# Patient Record
Sex: Male | Born: 1995 | Race: Black or African American | Hispanic: No | Marital: Single | Smoking: Current every day smoker
Health system: Southern US, Community
[De-identification: ages and names within clinical notes are randomized; demographics above are authoritative.]

## PROBLEM LIST (undated history)

## (undated) DIAGNOSIS — F32A Depression, unspecified: Secondary | ICD-10-CM

## (undated) DIAGNOSIS — J45909 Unspecified asthma, uncomplicated: Secondary | ICD-10-CM

## (undated) DIAGNOSIS — F319 Bipolar disorder, unspecified: Secondary | ICD-10-CM

## (undated) DIAGNOSIS — F329 Major depressive disorder, single episode, unspecified: Secondary | ICD-10-CM

---

## 2014-08-17 ENCOUNTER — Encounter (HOSPITAL_COMMUNITY): Payer: Self-pay | Admitting: *Deleted

## 2014-08-17 ENCOUNTER — Emergency Department (HOSPITAL_COMMUNITY)
Admission: EM | Admit: 2014-08-17 | Discharge: 2014-08-17 | Disposition: A | Discharge status: Home / Self Care | Payer: Medicaid Other | Attending: Emergency Medicine | Admitting: Emergency Medicine

## 2014-08-17 DIAGNOSIS — M549 Dorsalgia, unspecified: Secondary | ICD-10-CM | POA: Diagnosis not present

## 2014-08-17 DIAGNOSIS — J069 Acute upper respiratory infection, unspecified: Secondary | ICD-10-CM | POA: Diagnosis not present

## 2014-08-17 DIAGNOSIS — M791 Myalgia: Secondary | ICD-10-CM | POA: Diagnosis present

## 2014-08-17 DIAGNOSIS — R109 Unspecified abdominal pain: Secondary | ICD-10-CM | POA: Diagnosis not present

## 2014-08-17 DIAGNOSIS — Z720 Tobacco use: Secondary | ICD-10-CM | POA: Diagnosis not present

## 2014-08-17 MED ORDER — ARIPIPRAZOLE 15 MG PO TABS: 15.0000 mg | ORAL_TABLET | Freq: Every day | ORAL | Status: DC

## 2014-08-17 MED ORDER — BUPROPION HCL ER (XL) 150 MG PO TB24: 150.0000 mg | ORAL_TABLET | Freq: Every day | ORAL | Status: DC

## 2014-08-17 NOTE — ED Notes (Addendum)
Pt here with girlfriend and her mother- girlfriend's mother states that patient doesn't have anywhere to live at present and has been sleeping in the rain. Pt states that he has no access to OTC medications.

## 2014-08-17 NOTE — ED Notes (Signed)
The pt is c/o total body aches for  2 weeks.  Low back  Pain headache.  No n v  Or diarrhea.  No temp  Coughing up  White mucous.  He has a cold.  He just feels bad

## 2014-08-17 NOTE — ED Notes (Signed)
Pt placed in a gown with BP cuff and pulsed ox

## 2014-08-17 NOTE — ED Notes (Signed)
Pt given Mal[place] sandwich and water- per Dr. Romeo AppleHarrison.

## 2014-08-17 NOTE — ED Provider Notes (Signed)
CSN: [id number]     Arrival date & time 08/17/14  2013 History   First MD Initiated Contact with Patient 08/17/14 2241     Chief Complaint  Patient presents with  . Generalized Body Aches     (Consider location/radiation/quality/duration/timing/severity/associated sxs/prior Treatment) Patient is a 19 y.o. male presenting with headaches. The history is provided by the patient.  Headache Pain location:  Generalized Quality:  Dull Radiates to:  Does not radiate Severity currently:  0/10 Severity at highest:  6/10 Onset quality:  Gradual Duration:  3 days Timing:  Intermittent Progression:  Resolved Chronicity:  New Context comment:  At rest Relieved by:  Nothing Worsened by:  Nothing Ineffective treatments:  None tried Associated symptoms: abdominal pain, back pain and cough   Associated symptoms: no diarrhea, no eye pain, no fever, no nausea, no neck pain, no numbness and no vomiting     History reviewed. No pertinent past medical history. History reviewed. No pertinent past surgical history. No family history on file. History  Substance Use Topics  . Smoking status: Current Every Day Smoker  . Smokeless tobacco: Not on file  . Alcohol Use: No    Review of Systems  Constitutional: Negative for fever.  HENT: Positive for rhinorrhea. Negative for drooling.   Eyes: Negative for pain.  Respiratory: Positive for cough. Negative for shortness of breath.   Cardiovascular: Negative for chest pain and leg swelling.  Gastrointestinal: Positive for abdominal pain. Negative for nausea, vomiting and diarrhea.  Genitourinary: Negative for dysuria and hematuria.  Musculoskeletal: Positive for back pain. Negative for gait problem and neck pain.  Skin: Negative for color change.  Neurological: Positive for headaches. Negative for numbness.  Hematological: Negative for adenopathy.  Psychiatric/Behavioral: Negative for behavioral problems.  All other systems reviewed and are  negative.     Allergies  Review of patient's allergies indicates no known allergies.  Home Medications   Prior to Admission medications   Not on File   BP 111/66 mmHg  Pulse 55  Temp(Src) 97.6 F (36.4 C) (Oral)  Resp 18  Ht 5\' 8"  (1.727 m)  Wt 139 lb (63.05 kg)  BMI 21.14 kg/m2  SpO2 97% Physical Exam  Constitutional: He is oriented to person, place, and time. He appears well-developed and well-nourished.  HENT:  Head: Normocephalic and atraumatic.  Right Ear: External ear normal.  Left Ear: External ear normal.  Nose: Nose normal.  Mouth/Throat: Oropharynx is clear and moist. No oropharyngeal exudate.  Eyes: Conjunctivae and EOM are normal. Pupils are equal, round, and reactive to light.  Neck: Normal range of motion. Neck supple.  Cardiovascular: Normal rate, regular rhythm, normal heart sounds and intact distal pulses.  Exam reveals no gallop and no friction rub.   No murmur heard. Pulmonary/Chest: Effort normal and breath sounds normal. No respiratory distress. He has no wheezes.  Abdominal: Soft. Bowel sounds are normal. He exhibits no distension. There is no tenderness. There is no rebound and no guarding.  Musculoskeletal: Normal range of motion. He exhibits no edema or tenderness.  Neurological: He is alert and oriented to person, place, and time.  alert, oriented x3 speech: normal in context and clarity memory: intact grossly cranial nerves II-XII: intact motor strength: full proximally and distally no involuntary movements or tremors sensation: intact to light touch diffusely  cerebellar: finger-to-nose and heel-to-shin intact gait: normal forwards and backwards  Skin: Skin is warm and dry.  Psychiatric: He has a normal mood and affect. His behavior is normal.  Nursing note and vitals reviewed.   ED Course  Procedures (including critical care time) Labs Review Labs Reviewed - No data to display  Imaging Review No results found.   EKG  Interpretation None      MDM   Final diagnoses:  Viral URI    11:21 PM 20 y.o. male with a history of homelessness who presents with multiple complaints. He states that he developed a mild cough and congestion several days ago. He has also had a mild intermittent headache but denies this currently on exam. He denies any fevers, nausea, vomiting, or diarrhea. He has also had some mild nonspecific low back pain and some abdominal pain which he relates to hunger. He states that he feels depressed but denies any SI. His vital signs are unremarkable here. He likely has a viral syndrome and back pain likely related to sleeping outside. He states he has used up all his time at the shelter currently.  11:22 PM: The pt asked me to refill his meds which I think is reasonable and may help w/ his depression. I have discussed the diagnosis/risks/treatment options with the patient and believe the pt to be eligible for discharge home to follow-up with his psychiatrist as needed. We also discussed returning to the ED immediately if new or worsening sx occur. We discussed the sx which are most concerning (e.g., worsening HA, fever, sob) that necessitate immediate return. Medications administered to the patient during their visit and any new prescriptions provided to the patient are listed below.  Medications given during this visit Medications - No data to display  Discharge Medication List as of 08/17/2014 11:28 PM    START taking these medications   Details  ARIPiprazole (ABILIFY) 15 MG tablet Take 1 tablet (15 mg total) by mouth daily., Starting 08/17/2014, Until Discontinued, Print    buPROPion (WELLBUTRIN XL) 150 MG 24 hr tablet Take 1 tablet (150 mg total) by mouth daily., Starting 08/17/2014, Until Discontinued, Print           Purvis Sheffield, MD 08/18/14 [phone]

## 2014-08-17 NOTE — Discharge Instructions (Signed)
°Emergency Department Resource Guide °1) Find a Doctor and Pay Out of Pocket °Although you won't have to find out who is covered by your insurance plan, it is a good idea to ask around and get recommendations. You will then need to call the office and see if the doctor you have chosen will accept you as a new patient and what types of options they offer for patients who are self-pay. Some doctors offer discounts or will set up payment plans for their patients who do not have insurance, but you will need to ask so you aren't surprised when you get to your appointment. ° °2) Contact Your Local Health Department °Not all health departments have doctors that can see patients for sick visits, but many do, so it is worth a call to see if yours does. If you don't know where your local health department is, you can check in your phone book. The CDC also has a tool to help you locate your state's health department, and many state websites also have listings of all of their local health departments. ° °3) Find a Walk-in Clinic °If your illness is not likely to be very severe or complicated, you may want to try a walk in clinic. These are popping up all over the country in pharmacies, drugstores, and shopping centers. They're usually staffed by nurse practitioners or physician assistants that have been trained to treat common illnesses and complaints. They're usually fairly quick and inexpensive. However, if you have serious medical issues or chronic medical problems, these are probably not your best option. ° °No Primary Care Doctor: °- Call [facility] at  832-8000 - they can help you locate a primary care doctor that  accepts your insurance, provides certain services, etc. °- Physician Referral Service- 1-800-533-3463 ° °Chronic Pain Problems: °Organization         Address  Phone   Notes  °[facility]  (336) 297-2271 Patients need to be referred by their primary care doctor.  ° °Medication  Assistance: °Organization         Address  Phone   Notes  °[place] [facility] [address] °[place], [place] [address] (336) 641-8030 --Must be a resident of [place] °-- Must have NO insurance coverage whatsoever (no Medicaid/ Medicare, etc.) °-- The pt. MUST have a primary care doctor that directs their care regularly and follows them in the community °  °[facility]  (866) 331-1348   °[employer]  (888) 892-1162   ° °Agencies that provide inexpensive medical care: °Organization         Address  Phone   Notes  °[facility]  (336) 832-8035   °[facility]    (336) 832-7272   °[facility] [address] °[place], [place] [address] (336) 832-4777   °[facility] [address], °[place] (336) 271-4999   °[facility]    (336) 373-0678   °[facility]    (336) 272-1050   °[facility] ° [address], [place] Phone:  (336) 832-4444, Fax:  (336) 832-4440 Hours of Operation:  9 am - 6 pm, M-F.  Also accepts Medicaid/Medicare and self-pay.  °[facility] ° [address], [place] Phone: (336) 832-3150, Fax: (336) 832-3151. Hours of Operation:  8:30 am - 5:30 pm, M-F.  Also accepts Medicaid and self-pay.  °[facility] [place] [address], [place] Phone: (336) 878-6027   °[facility] [address], [place], [place] (336)723-1848, Ext. 123 Mondays & Thursdays: 7-9 AM.  First 15 patients are seen on a first come, first serve basis. °  ° °Medicaid-accepting [place] Providers: ° °Organization         Address  Phone   Notes  °[facility] [address], [place] (336) 641-2100 Also accepts self-pay patients.  °[facility] [address], [place] ° (336) 856-9996   °[facility] [address], [place]  (336) 288-8857   °[facility] [address] [place] [address], [place] (336) 299-7000   °Veita Bland [address], [place]  ° (336) 373-1557 Only accepts [place] Access Medicaid patients after they have their name applied to their card.  ° °Self-Pay (no insurance) in [place]: ° °Organization         Address  Phone   Notes  °Sickle Cell Patients, [facility] [address], [place] (336) 832-1970   °[facility] [address], [place] (336) 832-4400   °[facility] ° [address],  (336) 992-4800   °[facility]/Dr. Osei-Bonsu ° [address] [place] [address], [place] or [address], [place] (336) [phone] Phone number for both [place] and [place] locations is the same.  °[facility] [address], [place] (336) 299-0000   °[facility] [address] [place] [address], [place] or [address] (336) 852-7530 °(336) 878-2260   °[facility] [address], [place] (336) 350-1642, phone; (336) 294-5005, fax Sees patients 1st and 3rd Saturday of every month.  Must not qualify for public or private insurance (i.e. Medicaid, Medicare, [place] Health Choice, Veterans' Benefits) • Household income should be no more than 200% of the poverty level •The clinic cannot treat you if you are pregnant or think you are pregnant • Sexually transmitted diseases are not treated at the clinic.  ° ° °Dental Care: °Organization         Address  Phone  Notes  °[place] [facility] [address], [place] (336) 641-6152 Accepts children up to age 21 who are enrolled in Medicaid or [place] Health Choice; pregnant women with a Medicaid card; and children who have applied for Medicaid or [place] Health Choice, but were declined, whose parents can pay a reduced fee at time of service.  °[place]  [facility] [place]  [address], [place] (336) 641-7733 Accepts children up to age 21 who are enrolled in Medicaid or [place] Health Choice; pregnant women with a Medicaid card; and children who have applied for Medicaid or [place] Health Choice, but were declined, whose parents can pay a reduced fee at time of service.  °[facility] ° [address], [place] (336) 641-4533 Patients are seen by appointment only. Walk-ins are not accepted. [facility] will see patients 18 years of age and older. °Monday - Tuesday (8am-5pm) °Most Wednesdays (8:30-5pm) °$30 per visit, cash only  °[facility] ° [address], [place] (336) 641-4533 Patients are seen by appointment only. Walk-ins are not accepted. [facility] will see patients 18 years of age and older. °One   Wednesday Evening (Monthly: Volunteer Based).  $30 per visit, cash only  °[facility]  (919) 537-3737 for adults; Children under age 4, call Graduate Pediatric Dentistry at (919) [phone]. Children aged 4-14, please call (919) [phone] to request a pediatric application. ° Dental services are provided in all areas of dental care including fillings, crowns and bridges, complete and partial dentures, implants, gum treatment, root canals, and extractions. Preventive care is also provided. Treatment is provided to both adults and children. °Patients are selected via a lottery and there is often a waiting list. °  °[facility] [address], °[place] ° (336) 763-8833 [email or web address] °  °[facility] [address], [place], [place] (336)723-1848, Ext. 123 Second and Fourth Thursday of each month, opens at 6:30 AM; Clinic ends at 9 AM.  Patients are seen on a first-come first-served basis, and a limited number are seen during each clinic.  ° °[facility] ° [address], [place], [place] (336) 723-7904    Eligibility Requirements °You must have lived in [place], [place], or [place] counties for at least the last three months. °  You cannot be eligible for state or federal sponsored healthcare insurance, including Veterans Administration, Medicaid, or Medicare. °  You generally cannot be eligible for healthcare insurance through your employer.  °  How to apply: °Eligibility screenings are held every Tuesday and Wednesday afternoon from 1:00 pm until 4:00 pm. You do not need an appointment for the interview!  °[facility] [address], [place], [place] 336-631-2330   °[facility]  336-342-8273   °[facility]  336-703-3100   °[facility]  336-570-6415   ° °Behavioral Health Resources in the Community: °Intensive Outpatient Programs °Organization         Address  Phone  Notes  °[facility] [address], [place], [place] 336-878-6098   °[facility] [address], [place], [place] 336-832-9800   °[facility] [address], [place], [place] ° 336-882-2125   °[facility] [address],  °[place], [place] 1-800-853-5163 or 336-641-4981   °Substance Abuse Resources °Organization         Address  Phone  Notes  °[facility]  336-882-2125   °[facility]  336-784-9470   °[facility]  336-285-9073   °[facility]  336-845-3988   °Residential & Outpatient Substance Abuse Program  1-800-659-3381   °Psychological Services °Organization         Address  Phone  Notes  °[facility]  336- 832-9600   °[facility]  336- 378-7881   °[facility] [address], [place] 1-800-853-5163 or 336-641-4981   ° °Mobile Crisis Teams °Organization         Address  Phone  Notes  °[facility], Mobile Crisis Care Unit  1-877-626-1772   °Assertive °[facility] ° [address]  [place], [place] 336-834-9664   °Sharon DeEsch [address] °[place]  336-554-5454   ° °Self-Help/Support Groups °Organization         Address  Phone             Notes  °[facility] - variety of support groups  336- 373-1402 Call for more information  °Narcotics Anonymous (NA), [facility] [address], °[place]   2 meetings at this location  ° °  Residential Treatment Programs °Organization         Address  Phone  Notes  °[facility] [address],    °[place]  1-866-801-8205   °[facility] ° [address], [place], [place] 704-293-8524   °[facility] [address], [place] 336-845-3988 Admissions: 8am-3pm M-F  °[facility] [address]    °[place], [place] 336-841-1104   °[facility] [address], [place], [place] 336-379-7146   °[facility] [address]  °[place], [place] 336-285-9073   °[facility] - Intensive Outpatient [address], [place], [place] 336-852-3033   °[facility] [address]  °[place], [place] 1-877-615-2722 or 336-784-9470   °[facility] [address] [place], [place] 336-227-7417 Accepts Medicaid  °[facility] [address]  °[place] 1-800-659-3381 Substance Abuse/Addiction Treatment  ° °[place] Behavioral Health Resources °Organization         Address  Phone  Notes  °[facility]  (888) 581-9988   °Julie Kenneth, PhD [address] [place], [place]   (336) 349-5553 or (336) 951-0000   °[facility]   [address] °[place], [place] (336) 349-4454   °[facility] [address], [place], [place] (336) 342-8316 Insurance/Medicaid/sponsorship through [facility]  °[facility] [address]                                    [place], [place] (336) 342-8316 Therapy/tele-psych/case    °[facility] [address]  ° [place], [place] (336) 349-2233    °Dr. Arfeen  (336) 349-4544   °[facility] [place]  [employer] [place] [facility] 1) [address], [place] °2) [address], [place] °3)  [address], [place] (336) 349-3220 °(336) 342-7768 ° °(336) 342-8140   °[place] [facility] (336) 342-1394 or (336) 342-3537 (After Hours)    ° ° °

## 2014-11-05 ENCOUNTER — Encounter (HOSPITAL_COMMUNITY): Payer: Self-pay | Admitting: *Deleted

## 2014-11-05 ENCOUNTER — Emergency Department (HOSPITAL_COMMUNITY)
Admission: EM | Admit: 2014-11-05 | Discharge: 2014-11-09 | Disposition: A | Discharge status: Home / Self Care | Payer: Medicaid Other | Attending: Emergency Medicine | Admitting: Emergency Medicine

## 2014-11-05 DIAGNOSIS — F319 Bipolar disorder, unspecified: Secondary | ICD-10-CM | POA: Diagnosis not present

## 2014-11-05 DIAGNOSIS — T1491XA Suicide attempt, initial encounter: Secondary | ICD-10-CM | POA: Diagnosis present

## 2014-11-05 DIAGNOSIS — Z720 Tobacco use: Secondary | ICD-10-CM | POA: Insufficient documentation

## 2014-11-05 DIAGNOSIS — R45851 Suicidal ideations: Secondary | ICD-10-CM

## 2014-11-05 DIAGNOSIS — T1491 Suicide attempt: Secondary | ICD-10-CM | POA: Diagnosis not present

## 2014-11-05 DIAGNOSIS — F3163 Bipolar disorder, current episode mixed, severe, without psychotic features: Secondary | ICD-10-CM | POA: Diagnosis not present

## 2014-11-05 DIAGNOSIS — Z79899 Other long term (current) drug therapy: Secondary | ICD-10-CM | POA: Insufficient documentation

## 2014-11-05 DIAGNOSIS — F4320 Adjustment disorder, unspecified: Secondary | ICD-10-CM | POA: Diagnosis not present

## 2014-11-05 DIAGNOSIS — J45909 Unspecified asthma, uncomplicated: Secondary | ICD-10-CM | POA: Diagnosis not present

## 2014-11-05 HISTORY — DX: Depression, unspecified: F32.A

## 2014-11-05 HISTORY — DX: Unspecified asthma, uncomplicated: J45.909

## 2014-11-05 HISTORY — DX: Bipolar disorder, unspecified: F31.9

## 2014-11-05 HISTORY — DX: Major depressive disorder, single episode, unspecified: F32.9

## 2014-11-05 LAB — RAPID URINE DRUG SCREEN, HOSP PERFORMED
Amphetamines: NOT DETECTED
BARBITURATES: NOT DETECTED
Benzodiazepines: NOT DETECTED
COCAINE: NOT DETECTED
Opiates: NOT DETECTED
Tetrahydrocannabinol: NOT DETECTED

## 2014-11-05 LAB — COMPREHENSIVE METABOLIC PANEL
ALBUMIN: 4 g/dL (ref 3.5–5.0)
ALT: 13 U/L — ABNORMAL LOW (ref 17–63)
ANION GAP: 8 (ref 5–15)
AST: 24 U/L (ref 15–41)
Alkaline Phosphatase: 45 U/L (ref 38–126)
BUN: 9 mg/dL (ref 6–20)
CO2: 27 mmol/L (ref 22–32)
Calcium: 9.1 mg/dL (ref 8.9–10.3)
Chloride: 105 mmol/L (ref 101–111)
Creatinine, Ser: 0.78 mg/dL (ref 0.61–1.24)
Glucose, Bld: 119 mg/dL — ABNORMAL HIGH (ref 65–99)
Potassium: 3.1 mmol/L — ABNORMAL LOW (ref 3.5–5.1)
Sodium: 140 mmol/L (ref 135–145)
Total Bilirubin: 2.3 mg/dL — ABNORMAL HIGH (ref 0.3–1.2)
Total Protein: 7.2 g/dL (ref 6.5–8.1)

## 2014-11-05 LAB — CBC
HEMATOCRIT: 42.1 % (ref 39.0–52.0)
HEMOGLOBIN: 13.8 g/dL (ref 13.0–17.0)
MCH: 28.6 pg (ref 26.0–34.0)
MCHC: 32.8 g/dL (ref 30.0–36.0)
MCV: 87.2 fL (ref 78.0–100.0)
PLATELETS: 190 10*3/uL (ref 150–400)
RBC: 4.83 MIL/uL (ref 4.22–5.81)
RDW: 12.6 % (ref 11.5–15.5)
WBC: 7.1 10*3/uL (ref 4.0–10.5)

## 2014-11-05 LAB — SALICYLATE LEVEL: Salicylate Lvl: <4 mg/dL (ref 2.8–30.0)

## 2014-11-05 LAB — ETHANOL: Alcohol, Ethyl (B): <5 mg/dL (ref <5)

## 2014-11-05 LAB — ACETAMINOPHEN LEVEL
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
## 2014-11-05 NOTE — ED Notes (Signed)
Bed: [id number] Expected date:  Expected time:  Means of arrival:  Comments: EMS/overdose
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
## 2014-11-05 NOTE — Progress Notes (Signed)
EDCM confirmed via EPIC patient has Medicaid [employer] [employer] access insurance.  PC[facility].  System updated.

## 2014-11-06 DIAGNOSIS — T1491 Suicide attempt: Secondary | ICD-10-CM | POA: Diagnosis not present

## 2014-11-06 DIAGNOSIS — F3163 Bipolar disorder, current episode mixed, severe, without psychotic features: Secondary | ICD-10-CM | POA: Diagnosis not present

## 2014-11-06 DIAGNOSIS — T1491XA Suicide attempt, initial encounter: Secondary | ICD-10-CM | POA: Diagnosis present

## 2014-11-06 DIAGNOSIS — R45851 Suicidal ideations: Secondary | ICD-10-CM | POA: Insufficient documentation

## 2014-11-06 MED ORDER — POTASSIUM CHLORIDE CRYS ER 20 MEQ PO TBCR
40.0000 meq | EXTENDED_RELEASE_TABLET | Freq: Once | ORAL | Status: AC
  Administered 2014-11-06: 40 meq via ORAL
  Filled 2014-11-06: qty 2

## 2014-11-06 MED ORDER — ARIPIPRAZOLE 15 MG PO TABS
15.0000 mg | ORAL_TABLET | Freq: Every day | ORAL | Status: DC
  Administered 2014-11-07 – 2014-11-08 (×2): 15 mg via ORAL
  Filled 2014-11-06 (×4): qty 1

## 2014-11-06 MED ORDER — BUPROPION HCL ER (XL) 150 MG PO TB24
150.0000 mg | ORAL_TABLET | Freq: Every day | ORAL | Status: DC
  Administered 2014-11-07 – 2014-11-08 (×2): 150 mg via ORAL
  Filled 2014-11-06 (×4): qty 1
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
## 2014-11-06 NOTE — ED Notes (Signed)
Bed: WA27 Expected date:  Expected time:  Means of arrival:  Comments: 
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
## 2014-11-06 NOTE — ED Notes (Signed)
Spoke with [employer] [employer] and updated Douglas Frederick. PC reports that it is possible with Abilify and Wellbutrin overdoses for seizure activity to occur up to 20 hours after ingestion. Patient close to nurses' station. Will continue to closely monitor. No seizure activity noted thus far.
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
## 2014-11-06 NOTE — ED Notes (Signed)
Pt belongings placed in locker 27 

## 2014-11-06 NOTE — BHH Counselor (Signed)
[facility] Assessment Progress Note   [facility] and [facility] for patient to be considered for inpatient admission at these facilities.   Johny ShockSamantha M. Ladona Ridgelaylor, MS, N[facility] Observation Counselor

## 2014-11-06 NOTE — BH Assessment (Signed)
[facility] Assessment Progress Note  The following facilities have been contacted to seek placement for this patient, with results as noted:  Beds available, information sent, decision pending:  [facility]  At capacity:  [facility]  Tyronda Vizcarrondo, [place] Triage Specialist [phone]
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
## 2014-11-07 MED ORDER — TRAZODONE HCL 50 MG PO TABS: 50.0000 mg | ORAL_TABLET | Freq: Every evening | ORAL | Status: DC | PRN

## 2014-11-07 NOTE — BHH Counselor (Signed)
B[facility] Assessment Progress Note  Patient referral information for inpatient consideration has been sent, via fax, to [facility], [facility], [facility], [facility], [facility], [facility] and [facility].   Joanie Duprey M. Shepard Keltz, MS, NCC Observation Counselor   
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
## 2014-11-07 NOTE — ED Notes (Signed)
Patient awake and watching tv. Pt brightens on approach. Pt denies SI/HI or hallucinations at this time. Pt denies any anxiety. No s/s of distress noted at this time.

## 2014-11-08 DIAGNOSIS — R45851 Suicidal ideations: Secondary | ICD-10-CM | POA: Diagnosis not present

## 2014-11-08 DIAGNOSIS — F3163 Bipolar disorder, current episode mixed, severe, without psychotic features: Secondary | ICD-10-CM | POA: Diagnosis not present

## 2014-11-08 MED ORDER — AMANTADINE HCL 100 MG PO CAPS
100.0000 mg | ORAL_CAPSULE | Freq: Every day | ORAL | Status: DC
  Administered 2014-11-08: 100 mg via ORAL
  Filled 2014-11-08 (×2): qty 1

## 2014-11-08 NOTE — ED Notes (Signed)
Patient pleasant on approach. Reports he is here for increased depression and stress at home. Currently denies any SI or HI at present. Denies any pain or other issues at this time.
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
## 2014-11-08 NOTE — ED Notes (Signed)
Pt is very pleasant and cooperative this am. She remains in the bed and is very quiet only answering questions with a yes or no.Ot does contract for safety.

## 2014-11-08 NOTE — ED Notes (Addendum)
Pt has been sleeping most fo the am but does wake to his name being called. Pt ate 100% of his breakfast. He does appear depressed with a flat affect. Pt does contract for safety and denies SI and HI.DSS came to speak to the pt. Pt stated, "we have a son named ,Martie LeeSkyler, who is 346 months old." the baby stay with his GF's mother. Pt stated he has been gettign mental help for a year and feels he and his GF both need to go together so they can understand each other better. Pt appears very pleasant and willing to talk.to DSS.

## 2014-11-08 NOTE — BH Assessment (Signed)
[facility] Assessment Progress Note  The following facilities have been contacted to seek placement for this patient, with results as noted:  Beds available, information faxed, decision pending:  [facility]   [facility]  Douglas Frederick, [place] Triage Specialist [phone]

## 2014-11-09 MED ORDER — AMANTADINE HCL 100 MG PO CAPS: 100.0000 mg | ORAL_CAPSULE | Freq: Every day | ORAL | Status: DC

## 2014-11-09 MED ORDER — TRAZODONE HCL 50 MG PO TABS: 50.0000 mg | ORAL_TABLET | Freq: Every evening | ORAL | Status: AC | PRN

## 2014-11-09 MED ORDER — BUPROPION HCL ER (XL) 150 MG PO TB24: 150.0000 mg | ORAL_TABLET | Freq: Every day | ORAL | Status: DC

## 2014-11-09 MED ORDER — ARIPIPRAZOLE 15 MG PO TABS: 15.0000 mg | ORAL_TABLET | Freq: Every day | ORAL | Status: DC

## 2014-11-09 NOTE — ED Notes (Signed)
Patient states medicines make him "too sleepy" and requests HS administration.
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
## 2014-11-09 NOTE — [facility] Suicide Risk Assessment (Cosign Needed)
Suicide Risk Assessment  Discharge Assessment   [facility]   Demographic Factors:  Male, Low socioeconomic status, Living alone and Unemployed  Total Time spent with patient: 20 minutes  Musculoskeletal: Strength & Muscle Tone: within normal limits Gait & Station: normal Patient leans: N/A  Psychiatric Specialty Exam:     Blood pressure 103/62, pulse 52, temperature 98 F (36.7 C), temperature source Oral, resp. rate 16, SpO2 100 %.There is no weight on file to calculate BMI.  General Appearance: Casual  Eye Contact::  Good  Speech:  Clear and Coherent and Normal Rate409  Volume:  Normal  Mood:  Depressed  Affect:  Congruent  Thought Process:  Coherent, Goal Directed and Intact  Orientation:  Full (Time, Place, and Person)  Thought Content:  WDL  Suicidal Thoughts:  No  Homicidal Thoughts:  No  Memory:  Immediate;   Good Recent;   Good Remote;   Good  Judgement:  Fair  Insight:  Fair  Psychomotor Activity:  Normal  Concentration:  Good  Recall:  NA  Fund of Knowledge:Fair  Language: Good  Akathisia:  NA  Handed:  Right  AIMS (if indicated):     Assets:  Desire for Improvement Financial Resources/Insurance Housing  Sleep:     Cognition: WNL  ADL's:  Intact      Has this patient used any form of tobacco in the last 30 days? (Cigarettes, Smokeless Tobacco, Cigars, and/or Pipes) Yes, A prescription for an FDA-approved tobacco cessation medication was offered at discharge and the patient refused  Mental Status Per Nursing Assessment::   On Admission:     Current Mental Status by Physician: NA  Loss Factors: NA  Historical Factors: NA  Risk Reduction Factors:   Responsible for children under 20 years of age, Sense of responsibility to family and Living with another person, especially a relative  Continued Clinical Symptoms:  Depression:   Insomnia  Cognitive Features That Contribute To Risk:  Polarized thinking    Suicide  Risk:  Minimal: No identifiable suicidal ideation.  Patients presenting with no risk factors but with morbid ruminations; may be classified as minimal risk based on the severity of the depressive symptoms  Principal Problem: Bipolar affective disorder, mixed, severe Discharge Diagnoses:  Patient Active Problem List   Diagnosis Date Noted  . Bipolar affective disorder, mixed, severe [F31.63] 11/06/2014  . Suicide attempt [T14.91] 11/06/2014  . Suicidal ideation [R45.851]       Plan Of Care/Follow-up recommendations:  Activity:  as tolerated Diet:  regular  Is patient on multiple antipsychotic therapies at discharge:  No   Has Patient had three or more failed trials of antipsychotic monotherapy by history:  No  Recommended Plan for Multiple Antipsychotic Therapies: NA    Notnamed Scholz C   PMHNP-BC 11/09/2014, 1:43 PM

## 2014-11-09 NOTE — Discharge Instructions (Signed)
For your ongoing mental health needs, you are advised to follow up with [facility].  New and returning patients are seen at their walk-in clinic.  Walk-in hours are Monday - Friday from 8:00 am - 3:00 pm.  Walk-in patients are seen on a first come, first served basis.  Try to arrive as early as possible for he best chance of being seen the same day: ° °     [facility] °     [address] °     [place], [place] [address] °     [phone] °

## 2014-11-09 NOTE — BH Assessment (Signed)
[facility] Assessment Progress Note  Per Thedore MinsMojeed Akintayo, MD, this pt does not require psychiatric hospitalization at this time.  His IVC is to be rescinded and [facility] for [facility] providers.  IVC has been rescinded.  [facility] has been included in his discharge instructions.  Pt's nurse has been notified.  Doylene Canninghomas Coreena Rubalcava, MA Triage Specialist [phone]

## 2014-11-09 NOTE — ED Notes (Signed)
Patient has been sleeping on and off since 7pm tonight. Respirations even and non-labored

## 2014-11-09 NOTE — ED Notes (Signed)
Patient continues to sleep during the night. Denies any issues when awake

## 2015-03-10 ENCOUNTER — Emergency Department (HOSPITAL_COMMUNITY)
Admission: EM | Admit: 2015-03-10 | Discharge: 2015-03-14 | Disposition: A | Discharge status: Home / Self Care | Payer: Medicaid Other | Attending: Emergency Medicine | Admitting: Emergency Medicine

## 2015-03-10 DIAGNOSIS — F3163 Bipolar disorder, current episode mixed, severe, without psychotic features: Secondary | ICD-10-CM | POA: Diagnosis present

## 2015-03-10 DIAGNOSIS — F32A Depression, unspecified: Secondary | ICD-10-CM

## 2015-03-10 DIAGNOSIS — F419 Anxiety disorder, unspecified: Secondary | ICD-10-CM | POA: Insufficient documentation

## 2015-03-10 DIAGNOSIS — F1310 Sedative, hypnotic or anxiolytic abuse, uncomplicated: Secondary | ICD-10-CM | POA: Insufficient documentation

## 2015-03-10 DIAGNOSIS — Z720 Tobacco use: Secondary | ICD-10-CM | POA: Insufficient documentation

## 2015-03-10 DIAGNOSIS — F329 Major depressive disorder, single episode, unspecified: Secondary | ICD-10-CM | POA: Diagnosis not present

## 2015-03-10 DIAGNOSIS — R45851 Suicidal ideations: Secondary | ICD-10-CM | POA: Diagnosis not present

## 2015-03-10 DIAGNOSIS — X838XXA Intentional self-harm by other specified means, initial encounter: Secondary | ICD-10-CM

## 2015-03-10 DIAGNOSIS — Z79899 Other long term (current) drug therapy: Secondary | ICD-10-CM | POA: Insufficient documentation

## 2015-03-10 LAB — COMPREHENSIVE METABOLIC PANEL
ALT: 13 U/L — AB (ref 17–63)
ANION GAP: 6 (ref 5–15)
AST: 25 U/L (ref 15–41)
Albumin: 4.8 g/dL (ref 3.5–5.0)
Alkaline Phosphatase: 58 U/L (ref 38–126)
BUN: 11 mg/dL (ref 6–20)
CHLORIDE: 104 mmol/L (ref 101–111)
CO2: 29 mmol/L (ref 22–32)
CREATININE: 0.77 mg/dL (ref 0.61–1.24)
Calcium: 9.6 mg/dL (ref 8.9–10.3)
Glucose, Bld: 90 mg/dL (ref 65–99)
Potassium: 4.2 mmol/L (ref 3.5–5.1)
Sodium: 139 mmol/L (ref 135–145)
Total Bilirubin: 1.1 mg/dL (ref 0.3–1.2)
Total Protein: 7.8 g/dL (ref 6.5–8.1)

## 2015-03-10 LAB — CBC
HCT: 42.7 % (ref 39.0–52.0)
Hemoglobin: 14.5 g/dL (ref 13.0–17.0)
MCH: 29.3 pg (ref 26.0–34.0)
MCHC: 34 g/dL (ref 30.0–36.0)
MCV: 86.3 fL (ref 78.0–100.0)
PLATELETS: 213 10*3/uL (ref 150–400)
RBC: 4.95 MIL/uL (ref 4.22–5.81)
RDW: 12.8 % (ref 11.5–15.5)
WBC: 10 10*3/uL (ref 4.0–10.5)

## 2015-03-10 LAB — ETHANOL

## 2015-03-10 MED ORDER — STERILE WATER FOR INJECTION IJ SOLN
INTRAMUSCULAR | Status: AC
  Administered 2015-03-10: 1.2 mL
  Filled 2015-03-10: qty 10

## 2015-03-10 MED ORDER — TRAZODONE HCL 50 MG PO TABS: 50.0000 mg | ORAL_TABLET | Freq: Every evening | ORAL | Status: DC | PRN

## 2015-03-10 MED ORDER — BUPROPION HCL ER (XL) 150 MG PO TB24
150.0000 mg | ORAL_TABLET | Freq: Every day | ORAL | Status: DC
  Administered 2015-03-12 – 2015-03-14 (×2): 150 mg via ORAL
  Filled 2015-03-10 (×4): qty 1

## 2015-03-10 MED ORDER — AMANTADINE HCL 100 MG PO CAPS
100.0000 mg | ORAL_CAPSULE | Freq: Every day | ORAL | Status: DC
  Administered 2015-03-12 – 2015-03-14 (×2): 100 mg via ORAL
  Filled 2015-03-10 (×4): qty 1

## 2015-03-10 MED ORDER — ARIPIPRAZOLE 15 MG PO TABS
15.0000 mg | ORAL_TABLET | Freq: Every day | ORAL | Status: DC
  Administered 2015-03-12: 15 mg via ORAL
  Filled 2015-03-10 (×3): qty 1

## 2015-03-10 MED ORDER — ZIPRASIDONE MESYLATE 20 MG IM SOLR
10.0000 mg | Freq: Once | INTRAMUSCULAR | Status: AC
  Administered 2015-03-10: 10 mg via INTRAMUSCULAR
  Filled 2015-03-10: qty 20
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
## 2015-03-10 NOTE — ED Notes (Addendum)
Pt. To [facility] from ED on stretcher without difficulty, to room 34 . Report from Autumn RN. Pt. Sleeping, respirations regular and unlabored. Will observe security cameras and Q15 minute rounds.

## 2015-03-10 NOTE — ED Notes (Signed)
Bed: OZ30 Expected date:  Expected time:  Means of arrival:  Comments: [employer]

## 2015-03-10 NOTE — ED Notes (Signed)
Bilat hard  LE gurney cuffs removed
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
## 2015-03-10 NOTE — ED Notes (Signed)
Report called by Autumn RN to Jillyn Hidden RN for SAPU. Pt is resting quielty, in no acute distress. Skin is warm and dry. Pt was transported via stretcher to SAPU rm 34. Belongings transported with patient to SAPU.

## 2015-03-10 NOTE — ED Notes (Signed)
Pt. Noted sleeping in room. No complaints or concerns voiced. No distress or abnormal behavior noted. Will continue to monitor with security cameras. Q 15 minute rounds continue. 

## 2015-03-10 NOTE — ED Notes (Signed)
Posey belt removed, pt now appears calm, responsive to voice
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
## 2015-03-10 NOTE — ED Notes (Signed)
Pt attempted to use urinal at 18:45 was unable at that time
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
## 2015-03-11 DIAGNOSIS — R45851 Suicidal ideations: Secondary | ICD-10-CM

## 2015-03-11 DIAGNOSIS — F3163 Bipolar disorder, current episode mixed, severe, without psychotic features: Secondary | ICD-10-CM | POA: Diagnosis not present

## 2015-03-11 MED ORDER — DIPHENHYDRAMINE HCL 50 MG/ML IJ SOLN
INTRAMUSCULAR | Status: AC
  Filled 2015-03-11: qty 1

## 2015-03-11 MED ORDER — LORAZEPAM 2 MG/ML IJ SOLN
2.0000 mg | Freq: Once | INTRAMUSCULAR | Status: AC
  Administered 2015-03-11: 2 mg via INTRAMUSCULAR

## 2015-03-11 MED ORDER — DIPHENHYDRAMINE HCL 50 MG/ML IJ SOLN
50.0000 mg | Freq: Once | INTRAMUSCULAR | Status: AC
  Administered 2015-03-11: 50 mg via INTRAMUSCULAR

## 2015-03-11 MED ORDER — ZIPRASIDONE MESYLATE 20 MG IM SOLR
20.0000 mg | Freq: Once | INTRAMUSCULAR | Status: AC
  Administered 2015-03-11: 20 mg via INTRAMUSCULAR

## 2015-03-11 MED ORDER — ZIPRASIDONE MESYLATE 20 MG IM SOLR
INTRAMUSCULAR | Status: AC
  Filled 2015-03-11: qty 20

## 2015-03-11 MED ORDER — LORAZEPAM 2 MG/ML IJ SOLN
INTRAMUSCULAR | Status: AC
  Filled 2015-03-11: qty 1

## 2015-03-11 NOTE — ED Notes (Signed)
Pt. Noted sleeping in room. No complaints or concerns voiced. No distress or abnormal behavior noted. Will continue to monitor with security cameras. Q 15 minute rounds continue. 

## 2015-03-11 NOTE — BH Assessment (Signed)
[facility] Assessment Progress Note  The following facilities have been contacted to seek placement for this pt, with results as noted:  Beds available, information sent, decision pending:  [facility]   No beds available, but accepting referrals for future consideration; information sent:  [facility]   At capacity:  [facility]    Thomas Hughes, MA Triage Specialist [phone]     
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
## 2015-03-11 NOTE — Progress Notes (Signed)
CSW referred the patient to [facility] in order to try and seek inpatient placement.  Trish Mage [phone] ED CSW 03/11/2015 11:14 PM

## 2015-03-11 NOTE — ED Notes (Signed)
Pt sleeping at present, no distress noted,  Easily arouseable to verbal stimuli.  Monitoring for safety, Q 15 min checks in effect.

## 2015-03-11 NOTE — ED Notes (Signed)
Patient is resting quietly with eyes closed.  VS deferred until he is more arousable.  Report to next shift.

## 2015-03-12 MED ORDER — HALOPERIDOL LACTATE 5 MG/ML IJ SOLN
5.0000 mg | Freq: Once | INTRAMUSCULAR | Status: AC
  Administered 2015-03-12: 5 mg via INTRAMUSCULAR
  Filled 2015-03-12: qty 1

## 2015-03-12 MED ORDER — DIPHENHYDRAMINE HCL 50 MG/ML IJ SOLN
50.0000 mg | Freq: Once | INTRAMUSCULAR | Status: AC
  Administered 2015-03-12: 50 mg via INTRAMUSCULAR
  Filled 2015-03-12: qty 1

## 2015-03-12 MED ORDER — LORAZEPAM 2 MG/ML IJ SOLN
2.0000 mg | Freq: Once | INTRAMUSCULAR | Status: AC
  Administered 2015-03-12: 2 mg via INTRAMUSCULAR
  Filled 2015-03-12: qty 1

## 2015-03-12 NOTE — ED Notes (Signed)
Pt resting at present, no distress noted, easily arouseable to verbal stimuli.  Monitoring for safety, Q 15 min checks in effect.
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
## 2015-03-12 NOTE — ED Notes (Signed)
Pt encouraged to give a urine sample.  He is awake and eating but nonverbal.

## 2015-03-12 NOTE — ED Notes (Signed)
Pt attempting to stick fingers in electric sockets, pushing at doors. PA Spencer notified.

## 2015-03-12 NOTE — ED Notes (Signed)
Pt is sleeping all day.  He will wake up but only answer yes or no questions.  He is very difficult to assess.  Video monitoring and 15 minute checks continue.

## 2015-03-13 MED ORDER — LORAZEPAM 2 MG/ML IJ SOLN: 2.0000 mg | Freq: Once | INTRAMUSCULAR | Status: DC

## 2015-03-13 MED ORDER — QUETIAPINE FUMARATE 100 MG PO TABS: 200.0000 mg | ORAL_TABLET | Freq: Every day | ORAL | Status: DC

## 2015-03-13 MED ORDER — LORAZEPAM 2 MG/ML IJ SOLN
2.0000 mg | Freq: Once | INTRAMUSCULAR | Status: AC
  Administered 2015-03-13: 2 mg via INTRAMUSCULAR
  Filled 2015-03-13: qty 1

## 2015-03-13 MED ORDER — HALOPERIDOL LACTATE 5 MG/ML IJ SOLN
INTRAMUSCULAR | Status: AC
  Administered 2015-03-13: 5 mg
  Filled 2015-03-13: qty 1

## 2015-03-13 MED ORDER — HYDROXYZINE HCL 25 MG PO TABS: 50.0000 mg | ORAL_TABLET | Freq: Three times a day (TID) | ORAL | Status: DC | PRN

## 2015-03-13 MED ORDER — DIPHENHYDRAMINE HCL 50 MG/ML IJ SOLN
50.0000 mg | Freq: Once | INTRAMUSCULAR | Status: AC
  Administered 2015-03-13: 50 mg via INTRAMUSCULAR
  Filled 2015-03-13: qty 1

## 2015-03-13 MED ORDER — HALOPERIDOL LACTATE 5 MG/ML IJ SOLN
5.0000 mg | Freq: Once | INTRAMUSCULAR | Status: AC
  Administered 2015-03-13: 5 mg via INTRAMUSCULAR
  Filled 2015-03-13: qty 1

## 2015-03-13 MED ORDER — LORAZEPAM 2 MG/ML IJ SOLN
INTRAMUSCULAR | Status: AC
  Administered 2015-03-13: 2 mg
  Filled 2015-03-13: qty 1

## 2015-03-13 MED ORDER — ZIPRASIDONE MESYLATE 20 MG IM SOLR
20.0000 mg | Freq: Once | INTRAMUSCULAR | Status: AC
  Administered 2015-03-13: 20 mg via INTRAMUSCULAR
  Filled 2015-03-13: qty 20

## 2015-03-13 MED ORDER — ARIPIPRAZOLE 10 MG PO TABS
10.0000 mg | ORAL_TABLET | Freq: Every day | ORAL | Status: DC
  Administered 2015-03-14: 10 mg via ORAL
  Filled 2015-03-13 (×2): qty 1

## 2015-03-13 MED ORDER — QUETIAPINE FUMARATE 100 MG PO TABS: 100.0000 mg | ORAL_TABLET | Freq: Two times a day (BID) | ORAL | Status: DC

## 2015-03-13 MED ORDER — LORAZEPAM 1 MG PO TABS
1.0000 mg | ORAL_TABLET | Freq: Three times a day (TID) | ORAL | Status: DC | PRN
  Administered 2015-03-13 – 2015-03-14 (×2): 1 mg via ORAL
  Filled 2015-03-13 (×2): qty 1

## 2015-03-13 MED ORDER — HALOPERIDOL LACTATE 5 MG/ML IJ SOLN: 5.0000 mg | Freq: Once | INTRAMUSCULAR | Status: DC

## 2015-03-13 NOTE — ED Notes (Signed)
Restraints removed, pt sleeping at present,  1-1 sitter remains at bedside.
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
## 2015-03-13 NOTE — ED Notes (Signed)
Pt came out of room and attempted to push exit door multiple times. Staff attempted to redirect. Pt went to his room and climbed on the side shelf jumping to the floor and caught by security before hitting the floor. Pt started banging his head on the wall stating that he wanted to die. Writer notified EDP as this was happening and received medication orders. Medications given and staff stayed with pt giving him [place], offering crackers and escorting to bathroom. Safety maintained in the [facility].

## 2015-03-13 NOTE — ED Notes (Signed)
Unable to give morning PO medications. Pt is sleeping. Reported to NP/ok to hold at this time.

## 2015-03-13 NOTE — ED Provider Notes (Signed)
7:45 AM Called by nursing staff because patient has become more aggressive and agitated. Pounding on doors, trying to stick finger in sockets and saying he wants to die. Trying to climb up on bed/wall mounts and jump off. Will give haldol/ativan/benadryl to sedate.  Pricilla Loveless, MD 03/13/15 [phone]

## 2015-03-13 NOTE — ED Notes (Addendum)
Pt agitated, combative out of restraints, [employer] and staff at bedside for assistance.  Med orders given.  Pt placed back in restraints.  1-1 sitter at bedside.  Pt very aggressive, attempted to bite, kick and spit on staff members.  Pt repeated, I am going to bite you, I am going to bite you.

## 2015-03-13 NOTE — Consult Note (Signed)
  Psychiatric Specialty Exam: Physical Exam  ROS  Blood pressure 98/62, pulse 80, temperature 97.8 F (36.6 C), temperature source Oral, resp. rate 14, SpO2 100 %.There is no weight on file to calculate BMI.  General Appearance: Casual  Eye Contact:: Good  Speech: Normal Rate  Volume: Normal  Mood: Irritable  Affect: Congruent  Thought Process: Coherent  Orientation: Full (Time, Place, and Person)  Thought Content: WDL  Suicidal Thoughts: Yes. with intent/plan  Homicidal Thoughts: No  Memory: Immediate; Fair Recent; Fair Remote; Fair  Judgement: Poor  Insight: Fair  Psychomotor Activity: Normal  Concentration: Good  Recall: Good  Fund of Knowledge:Good  Language: Good  Akathisia: No  Handed: Right  AIMS (if indicated):    Assets: Leisure Time Physical Health Resilience  ADL's: Intact  Cognition: WNL           Remains agitated and needing emergency medication.  Patient tries to jump off the counter in his room.  Minimal interaction with staff, redirected constantly to stop jumping off the counter.  Asked if he needed anything he said no.  Patient noted with flat affect and would not make eye contact with staff.  We will continue to monitor patient and medicate as needed.  We are still seeking inpatient hospitalization.  Bipolar affective disorder, mixed, severe   Plan:  Continue plan of care, seek in[patient hospitalization.  Dahlia Byes   PMHNP-BC Patient seen face-to-face for psychiatric evaluation, chart reviewed and case discussed with the physician extender and developed treatment plan. Reviewed the information documented and agree with the treatment plan. Thedore Mins, MD

## 2015-03-13 NOTE — ED Notes (Signed)
Pt attempted to climb on cabinet in room and jump to floor. Pt redirected and assisted to climb back down. Safety maintained.

## 2015-03-13 NOTE — ED Notes (Signed)
Pt woke up and climbed on the shelf in his room attempting to jump to the floor. Staff intervened and assisted pt as he jumped. Safety maintained.
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
## 2015-03-13 NOTE — ED Notes (Signed)
Pt woke up went to the exit doors pushing and attempting to open them. Pt required redirection by staff. Safety maintained.

## 2015-03-14 DIAGNOSIS — F3163 Bipolar disorder, current episode mixed, severe, without psychotic features: Secondary | ICD-10-CM | POA: Diagnosis not present

## 2015-03-14 LAB — RAPID URINE DRUG SCREEN, HOSP PERFORMED
AMPHETAMINES: NOT DETECTED
BARBITURATES: NOT DETECTED
BENZODIAZEPINES: POSITIVE — AB
COCAINE: NOT DETECTED
Opiates: NOT DETECTED
Tetrahydrocannabinol: NOT DETECTED

## 2015-03-14 NOTE — Consult Note (Signed)
  Psychiatric Specialty Exam: Physical Exam  ROS  Blood pressure 116/70, pulse 58, temperature 97.7 F (36.5 C), temperature source Oral, resp. rate 18, SpO2 99 %.There is no weight on file to calculate BMI.  General Appearance: Well Groomed  Patent attorney::  Good  Speech:  Clear and Coherent  Volume:  Normal  Mood:  Euthymic  Affect:  Congruent  Thought Process:  Coherent  Orientation:  Full (Time, Place, and Person)  Thought Content:  Negative  Suicidal Thoughts:  No  Homicidal Thoughts:  No  Memory:  Immediate;   Good Recent;   Good Remote;   Good  Judgement:  Fair  Insight:  Lacking  Psychomotor Activity:  Normal  Concentration:  Good  Recall:  Good  Fund of Knowledge:  Good  Language:  Good  Akathisia:  Negative  Handed:  Right  AIMS (if indicated):     Assets:  Communication Skills Housing Vocational/Educational  ADL's:  Intact  Cognition:  Question cognitive skills  Sleep:   good   Bipolar affective disorder, mixed, severe   Mr Grieder says he has no suicidal thoughts or intent.  Feels more resolved with the family dispute and wants to call his girlfriend to see if he can make things better with her.  He knows she has taken out assault charge papers against him.  Wants to go home.  Denies any wish to hurt his girlfriend or anyone else.  He will likely be taken to jail because of the assault. He continues acting inappropriately saying he is bored (going into a peer's room, sending that same peer a note, throwing food, but done with smiling. "I didn't know it would get me into trouble").  He is still in high school at aged 19 so he probably has cognitive limitations of some sort. He wants to be discharged.  Carolanne Grumbling MD, psychiatrist

## 2015-03-14 NOTE — Consult Note (Signed)
  Addendum: Plan:  Discharge home today.  Because of the charges he will be picked up by the police.

## 2015-03-14 NOTE — ED Notes (Addendum)
Pt is pacing hallway, acting bizarre, laughing, and singing.  He is throwing sausage in other pt. rooms around unit.  15 minute checks and video monitoring continues

## 2015-03-14 NOTE — ED Notes (Signed)
Received pt in 4 point restraints, 1-1 sitter at bedside, pt remains agitated. Awake, alert & responsive, no distress noted.  Monitoring for safety, Q 15 min checks in effect.

## 2015-03-14 NOTE — ED Notes (Signed)
Pt discharged in police custody.  All belongings were returned to pt. At discharge.  Pt was ambulatory and in no distress at discharge.

## 2015-03-14 NOTE — [facility] Suicide Risk Assessment (Signed)
[facility] Discharge Suicide Risk Assessment   Demographic Factors:  Male, Adolescent or young adult and Low socioeconomic status  Total Time spent with patient: 30 minutes  Musculoskeletal: Strength & Muscle Tone: within normal limits Gait & Station: normal Patient leans: N/A  Psychiatric Specialty Exam: Physical Exam  ROS  Blood pressure 116/70, pulse 58, temperature 97.7 F (36.5 C), temperature source Oral, resp. rate 18, SpO2 99 %.There is no weight on file to calculate BMI.  General Appearance: Well Groomed  Patent attorney::  Good  Speech:  Clear and Coherent409  Volume:  Normal  Mood:  Euthymic  Affect:  Congruent  Thought Process:  Coherent  Orientation:  Full (Time, Place, and Person)  Thought Content:  Negative  Suicidal Thoughts:  No  Homicidal Thoughts:  No  Memory:  Immediate;   Good Recent;   Good Remote;   Good  Judgement:  Fair  Insight:  Lacking  Psychomotor Activity:  Normal  Concentration:  Good  Recall:  Good  Fund of Knowledge:Good  Language: Good  Akathisia:  Negative  Handed:  Right  AIMS (if indicated):     Assets:  Communication Skills Desire for Improvement Housing Vocational/Educational  Sleep:     Cognition: WNL  ADL's:  Intact      Has this patient used any form of tobacco in the last 30 days? (Cigarettes, Smokeless Tobacco, Cigars, and/or Pipes) N/A  Mental Status Per Nursing Assessment::   On Admission:     Current Mental Status by Physician: NA  Loss Factors: stress with his girlfriend  Historical Factors: Impulsivity  Risk Reduction Factors:   Responsible for children under 6 years of age, Sense of responsibility to family and Positive social support  Continued Clinical Symptoms:  Bipolar Disorder:   Depressive phase  Cognitive Features That Contribute To Risk:  None    Suicide Risk:  Mild:  Suicidal ideation of limited frequency, intensity, duration, and specificity.  There are no identifiable plans, no associated  intent, mild dysphoria and related symptoms, good self-control (both objective and subjective assessment), few other risk factors, and identifiable protective factors, including available and accessible social support.  Principal Problem: Bipolar affective disorder, mixed, severe Discharge Diagnoses:  Patient Active Problem List   Diagnosis Date Noted  . Bipolar affective disorder, mixed, severe [F31.63] 11/06/2014    Priority: Low  . Suicide attempt [T14.91] 11/06/2014  . Suicidal ideation [R45.851]       Plan Of Care/Follow-up recommendations:  Activity:  no activity restrictions Diet:  no diet restrictions  Is patient on multiple antipsychotic therapies at discharge:  No   Has Patient had three or more failed trials of antipsychotic monotherapy by history:  No  Recommended Plan for Multiple Antipsychotic Therapies: NA    Carolanne Grumbling 03/14/2015, 10:47 AM

## 2015-03-17 NOTE — ED Notes (Signed)
Pt belongings Bag found under desk-attempted to call pt-number not working.-note-bag did not contain belongings but had restraints in it.

## 2016-05-04 ENCOUNTER — Emergency Department (HOSPITAL_COMMUNITY): Payer: Medicaid Other

## 2016-05-04 ENCOUNTER — Encounter (HOSPITAL_COMMUNITY): Payer: Self-pay

## 2016-05-04 ENCOUNTER — Emergency Department (HOSPITAL_COMMUNITY)
Admission: EM | Admit: 2016-05-04 | Discharge: 2016-05-04 | Disposition: A | Discharge status: Home / Self Care | Payer: Medicaid Other | Attending: Emergency Medicine | Admitting: Emergency Medicine

## 2016-05-04 DIAGNOSIS — J45909 Unspecified asthma, uncomplicated: Secondary | ICD-10-CM | POA: Diagnosis not present

## 2016-05-04 DIAGNOSIS — Z79899 Other long term (current) drug therapy: Secondary | ICD-10-CM | POA: Insufficient documentation

## 2016-05-04 DIAGNOSIS — F17210 Nicotine dependence, cigarettes, uncomplicated: Secondary | ICD-10-CM | POA: Diagnosis not present

## 2016-05-04 DIAGNOSIS — J181 Lobar pneumonia, unspecified organism: Secondary | ICD-10-CM | POA: Diagnosis not present

## 2016-05-04 DIAGNOSIS — J189 Pneumonia, unspecified organism: Secondary | ICD-10-CM

## 2016-05-04 DIAGNOSIS — R05 Cough: Secondary | ICD-10-CM | POA: Diagnosis present

## 2016-05-04 LAB — COMPREHENSIVE METABOLIC PANEL
ALK PHOS: 50 U/L (ref 38–126)
ALT: 15 U/L — ABNORMAL LOW (ref 17–63)
ANION GAP: 10 (ref 5–15)
AST: 26 U/L (ref 15–41)
Albumin: 3.6 g/dL (ref 3.5–5.0)
BILIRUBIN TOTAL: 1.4 mg/dL — AB (ref 0.3–1.2)
BUN: 5 mg/dL — AB (ref 6–20)
CALCIUM: 9 mg/dL (ref 8.9–10.3)
CO2: 25 mmol/L (ref 22–32)
Chloride: 101 mmol/L (ref 101–111)
Creatinine, Ser: 0.73 mg/dL (ref 0.61–1.24)
GFR calc Af Amer: >60 mL/min (ref >60)
Glucose, Bld: 123 mg/dL — ABNORMAL HIGH (ref 65–99)
POTASSIUM: 3.7 mmol/L (ref 3.5–5.1)
Sodium: 136 mmol/L (ref 135–145)
TOTAL PROTEIN: 7.5 g/dL (ref 6.5–8.1)

## 2016-05-04 LAB — CBC WITH DIFFERENTIAL/PLATELET
BASOS ABS: 0 10*3/uL (ref 0.0–0.1)
BASOS PCT: 0 %
EOS PCT: 2 %
Eosinophils Absolute: 0.2 10*3/uL (ref 0.0–0.7)
HCT: 40.5 % (ref 39.0–52.0)
Hemoglobin: 13.4 g/dL (ref 13.0–17.0)
LYMPHS PCT: 13 %
Lymphs Abs: 1.5 10*3/uL (ref 0.7–4.0)
MCH: 28.5 pg (ref 26.0–34.0)
MCHC: 33.1 g/dL (ref 30.0–36.0)
MCV: 86 fL (ref 78.0–100.0)
Monocytes Absolute: 0.9 10*3/uL (ref 0.1–1.0)
Monocytes Relative: 8 %
Neutro Abs: 8.8 10*3/uL — ABNORMAL HIGH (ref 1.7–7.7)
Neutrophils Relative %: 77 %
PLATELETS: 263 10*3/uL (ref 150–400)
RBC: 4.71 MIL/uL (ref 4.22–5.81)
RDW: 12.8 % (ref 11.5–15.5)
WBC: 11.4 10*3/uL — AB (ref 4.0–10.5)

## 2016-05-04 MED ORDER — DOXYCYCLINE HYCLATE 100 MG PO CAPS: 100.0000 mg | ORAL_CAPSULE | Freq: Two times a day (BID) | ORAL | 0 refills | Status: DC

## 2016-05-04 MED ORDER — BENZONATATE 100 MG PO CAPS: 200.0000 mg | ORAL_CAPSULE | Freq: Two times a day (BID) | ORAL | 0 refills | Status: DC | PRN

## 2016-05-04 NOTE — ED Provider Notes (Signed)
[facility] Provider Note   CSN: [id number] Arrival date & time: 05/04/16  1756  By signing my name below, I, Douglas Frederick, attest that this documentation has been prepared under the direction and in the presence of Douglas Frederick, [place]. Electronically Signed: Morene CrockerKevin Frederick, Scribe. 05/04/16. 6:51 PM.  History   Chief Complaint Chief Complaint  Patient presents with  . flu like symptoms    The history is provided by the patient. No language interpreter was used.    HPI Comments: Douglas Frederick is a 20 y.o. male who presents to the Emergency Department complaining of persistent cough with associated  lower back pain, and fever(TMAX 101.2) x 2 weeks. He reports using OTC medication with little relief. He denies h/o chronic medical problems, recent smoking but has a smoking history. Pt reports NKDA. Denies chills, n/v/d.  Past Medical History:  Diagnosis Date  . Asthma   . Bipolar 1 disorder (HCC)   . Depression     Patient Active Problem List   Diagnosis Date Noted  . Bipolar affective disorder, mixed, severe (HCC) 11/06/2014  . Suicide attempt 11/06/2014  . Suicidal ideation     History reviewed. No pertinent surgical history.     Home Medications    Prior to Admission medications   Medication Sig Start Date End Date Taking? Authorizing Provider  amantadine (SYMMETREL) 100 MG capsule Take 1 capsule (100 mg total) by mouth daily. Patient not taking: Reported on 05/04/2016 11/09/14   Earney NavyJosephine C Onuoha, NP  ARIPiprazole (ABILIFY) 15 MG tablet Take 1 tablet (15 mg total) by mouth daily. Patient not taking: Reported on 05/04/2016 11/09/14   Earney NavyJosephine C Onuoha, NP  buPROPion (WELLBUTRIN XL) 150 MG 24 hr tablet Take 1 tablet (150 mg total) by mouth daily. Patient not taking: Reported on 05/04/2016 11/09/14   Earney NavyJosephine C Onuoha, NP  traZODone (DESYREL) 50 MG tablet Take 1 tablet (50 mg total) by mouth at bedtime as needed for sleep. Patient not taking: Reported on 05/04/2016  11/09/14   Earney NavyJosephine C Onuoha, NP    Family History No family history on file.  Social History Social History  Substance Use Topics  . Smoking status: Current Every Day Smoker    Types: Cigarettes  . Smokeless tobacco: Not on file  . Alcohol use No     Allergies   Patient has no known allergies.   Review of Systems Review of Systems  Constitutional: Positive for fever (TMAX 101.2). Negative for chills.  Respiratory: Positive for cough.   Musculoskeletal: Positive for back pain (lower).     Physical Exam Updated Vital Signs BP 114/76 (BP Location: Right Arm)   Pulse 103   Temp 99.9 F (37.7 C) (Oral)   Resp 22   SpO2 97%   Physical Exam Physical Exam  Constitutional: Pt  is oriented to person, place, and time. Appears well-developed and well-nourished. No distress.  HENT:  Head: Normocephalic and atraumatic.  Right Ear: Tympanic membrane, external ear and ear canal normal.  Left Ear: Tympanic membrane, external ear and ear canal normal.  Nose: Mucosal edema and moderate rhinorrhea present. No epistaxis. Right sinus exhibits no maxillary sinus tenderness and no frontal sinus tenderness. Left sinus exhibits no maxillary sinus tenderness and no frontal sinus tenderness.  Mouth/Throat: Uvula is midline and mucous membranes are normal. Mucous membranes are not pale and not cyanotic. No oropharyngeal exudate, posterior oropharyngeal edema, posterior oropharyngeal erythema or tonsillar abscesses.  Eyes: Conjunctivae are normal. Pupils are equal, round, and reactive to  light.  Neck: Normal range of motion and full passive range of motion without pain.  Cardiovascular: Normal rate and intact distal pulses.   Pulmonary/Chest: Effort normal and breath sounds normal. No stridor.  Mild right lower lobe rale, otherwise clear throughout Abdominal: Soft. Bowel sounds are normal. There is no tenderness.  Musculoskeletal: Normal range of motion.  Lymphadenopathy:    Pthas no  cervical adenopathy.  Neurological: Pt is alert and oriented to person, place, and time.  Skin: Skin is warm and dry. No rash noted. Pt is not diaphoretic.  Psychiatric: Normal mood and affect.  Nursing note and vitals reviewed.   ED Treatments / Results  DIAGNOSTIC STUDIES: Oxygen Saturation is 97% on RA, normal by my interpretation.    COORDINATION OF CARE: 6:46 PM Discussed treatment plan with pt at bedside and pt agreed to plan.   Labs (all labs ordered are listed, but only abnormal results are displayed) Labs Reviewed  CBC WITH DIFFERENTIAL/PLATELET - Abnormal; Notable for the following:       Result Value   WBC 11.4 (*)    Neutro Abs 8.8 (*)    All other components within normal limits  COMPREHENSIVE METABOLIC PANEL    EKG  EKG Interpretation None       Radiology Dg Chest 2 View  Result Date: 05/04/2016 CLINICAL DATA:  Fever EXAM: CHEST  2 VIEW COMPARISON:  None. FINDINGS: There is consolidation in the posterior basal segment of the right lower lobe. Left lung is clear. Normal heart size. No pneumothorax. IMPRESSION: Right lower lobe pneumonia. Electronically Signed   By: Jolaine ClickArthur  Hoss M.D.   On: 05/04/2016 18:33    Procedures Procedures (including critical care time)  Medications Ordered in ED Medications - No data to display   Initial Impression / Assessment and Plan / ED Course  I have reviewed the triage vital signs and the nursing notes.  Pertinent labs & imaging results that were available during my care of the patient were reviewed by me and considered in my medical decision making (see chart for details).  Clinical Course     Patient with cough, fever 2 weeks. Mildly elevated white blood cell count to 11.4. Chest x-ray is consistent with right lower lobe pneumonia. Skull exam is also consistent with this finding. Will treat with doxycycline. Recommend primary care follow-up. Return precautions given. Patient stable and ready for discharge.  Final  Clinical Impressions(s) / ED Diagnoses   Final diagnoses:  Community acquired pneumonia of right lower lobe of lung (HCC)    New Prescriptions Discharge Medication List as of 05/04/2016  6:54 PM    START taking these medications   Details  benzonatate (TESSALON) 100 MG capsule Take 2 capsules (200 mg total) by mouth 2 (two) times daily as needed for cough., Starting Mon 05/04/2016, Print    doxycycline (VIBRAMYCIN) 100 MG capsule Take 1 capsule (100 mg total) by mouth 2 (two) times daily., Starting Mon 05/04/2016, Print       I personally performed the services described in this documentation, which was scribed in my presence. The recorded information has been reviewed and is accurate.       Douglas Horsemanobert Kima Malenfant, PA-C 05/04/16 1945    Eber HongBrian Miller, MD 05/05/16 1902

## 2016-05-04 NOTE — ED Notes (Signed)
Papers reviewed with patient and they are requesting bus passes because they arrived by ambulance and have no ride home. They verbalize understanding and intent to follow up

## 2016-05-04 NOTE — ED Triage Notes (Signed)
Pt presents via [facility] for evaluation of flu like symptoms x 2 weeks. Pt reports productive cough. Pt. Reports fever 102, given 1000 mg tylenol PTA. Pt denies N/V, reports diarrhea.

## 2017-05-25 IMAGING — DX DG CHEST 2V
2 series · 2 of 2 positions shown · non-contrast
Comparison: None.

CLINICAL DATA: Fever

EXAM:
CHEST  2 VIEW

[chest pa]
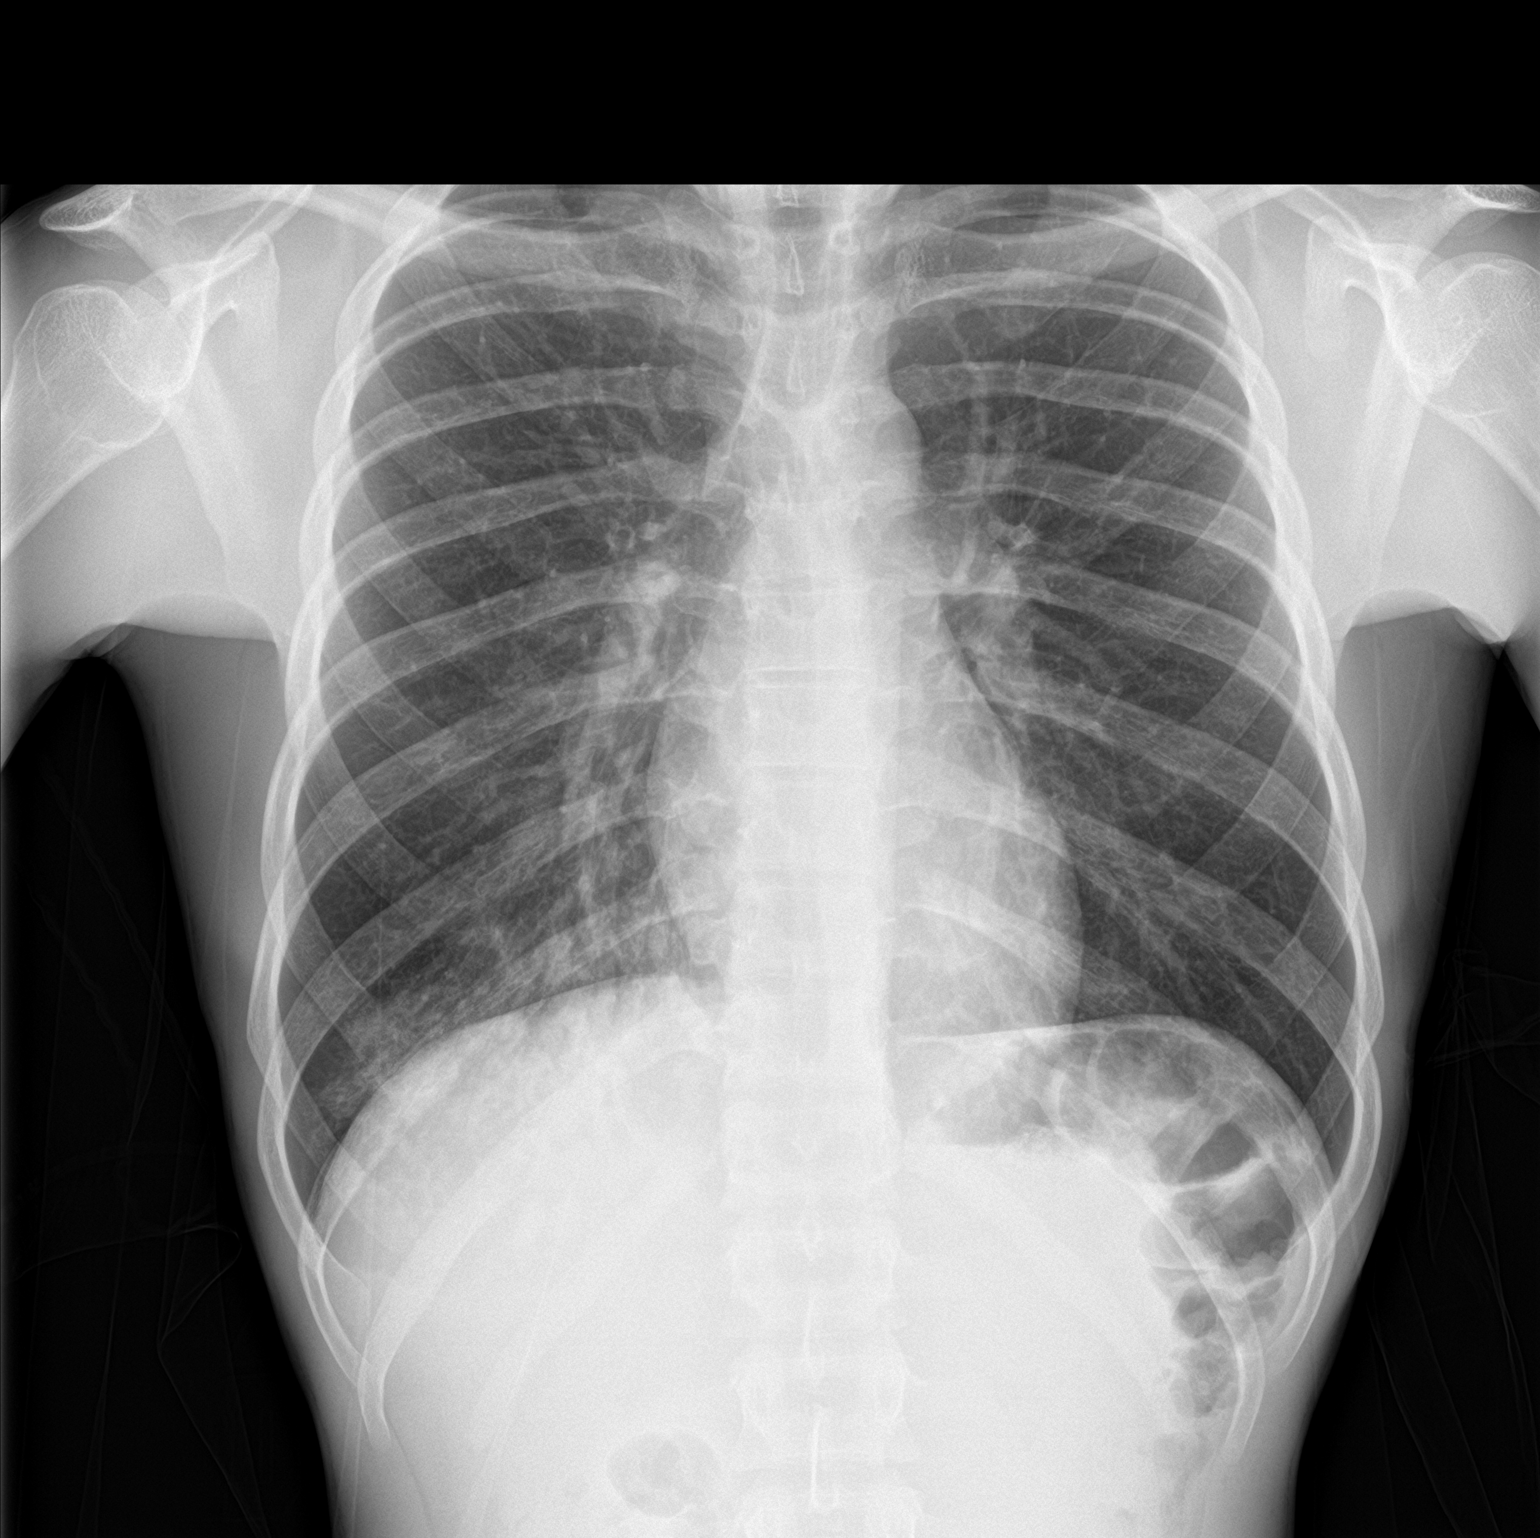

[chest lat]
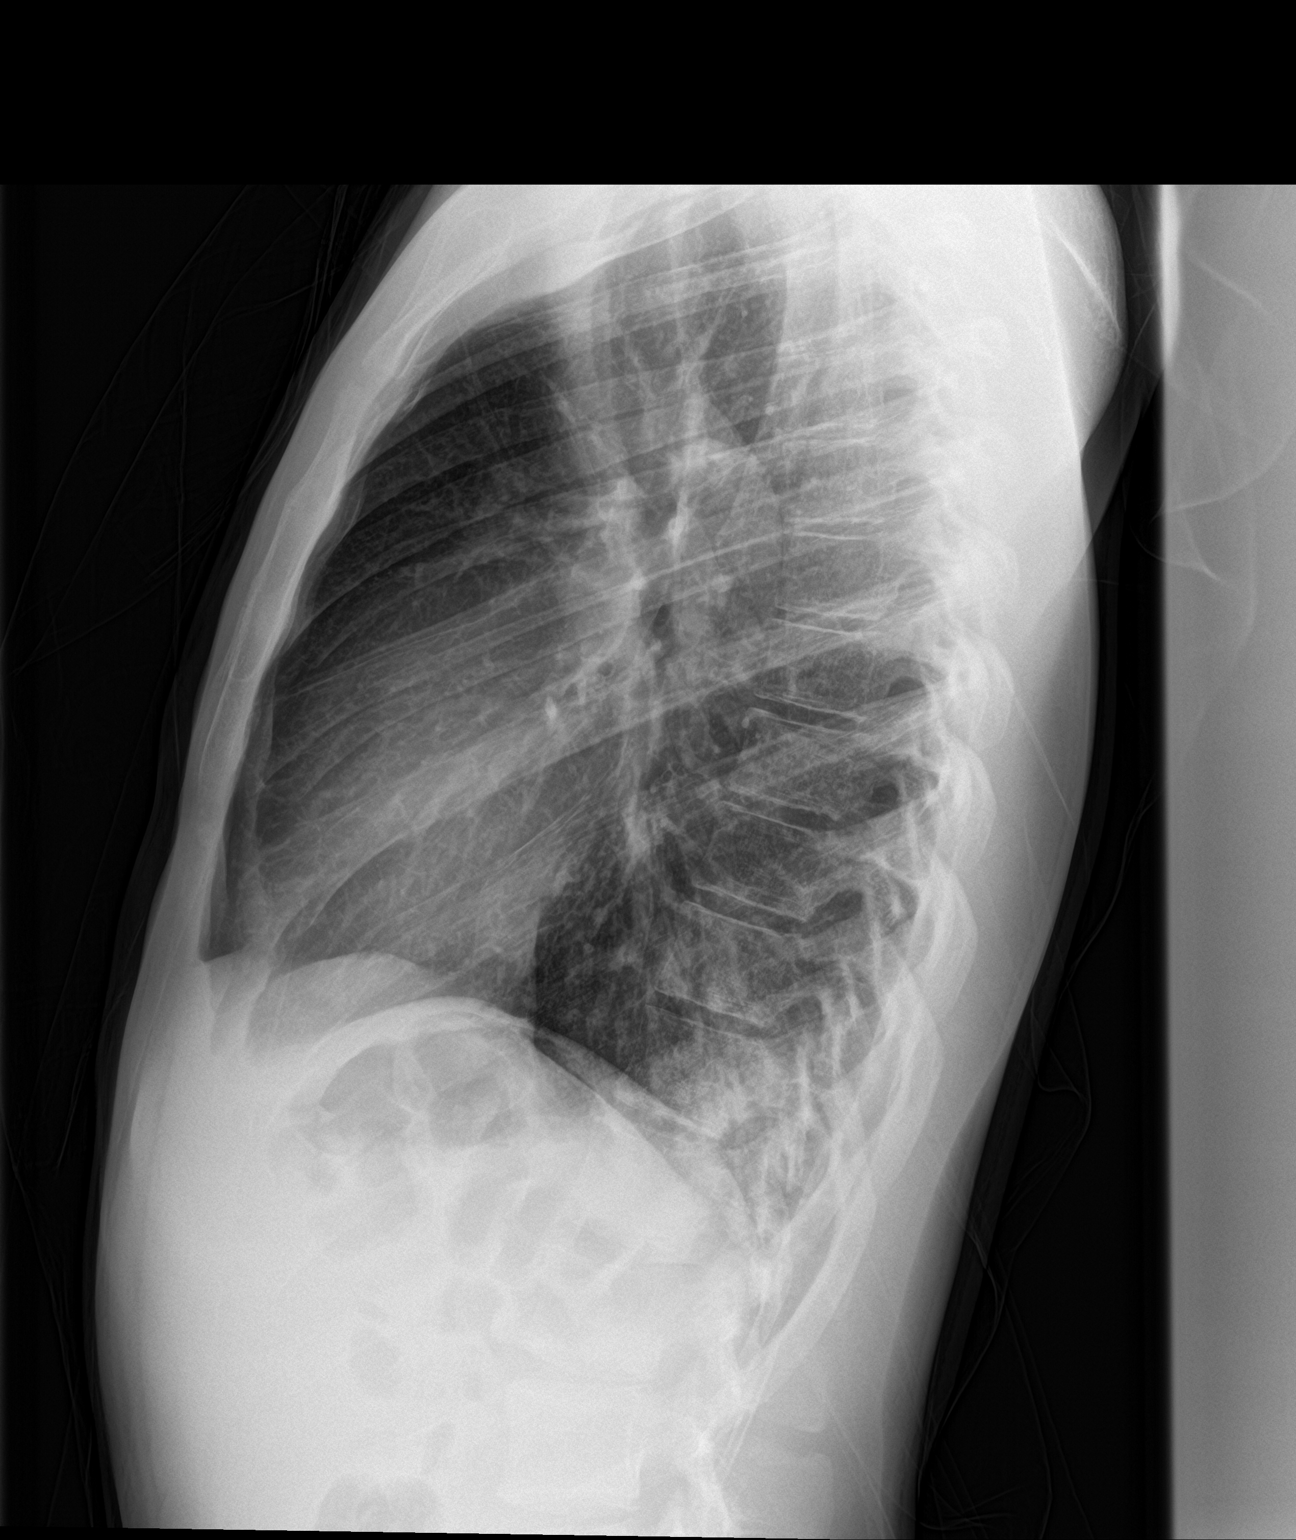

[2 of 2 positions shown; findings below may reference images not displayed]

FINDINGS: There is consolidation in the posterior basal segment of the right
lower lobe. Left lung is clear. Normal heart size. No pneumothorax.
IMPRESSION: Right lower lobe pneumonia.

## 2018-06-07 ENCOUNTER — Ambulatory Visit (HOSPITAL_COMMUNITY)
Admission: EM | Admit: 2018-06-07 | Discharge: 2018-06-07 | Disposition: A | Discharge status: Home / Self Care | Payer: Self-pay | Attending: Family Medicine | Admitting: Family Medicine

## 2018-06-07 ENCOUNTER — Encounter (HOSPITAL_COMMUNITY): Payer: Self-pay | Admitting: Emergency Medicine

## 2018-06-07 DIAGNOSIS — J069 Acute upper respiratory infection, unspecified: Secondary | ICD-10-CM | POA: Insufficient documentation

## 2018-06-07 MED ORDER — BENZONATATE 200 MG PO CAPS: 200.0000 mg | ORAL_CAPSULE | Freq: Two times a day (BID) | ORAL | 0 refills | Status: AC | PRN

## 2018-06-07 MED ORDER — IBUPROFEN 800 MG PO TABS: 800.0000 mg | ORAL_TABLET | Freq: Three times a day (TID) | ORAL | 0 refills | Status: AC

## 2018-06-07 NOTE — ED Provider Notes (Signed)
[facility]    CSN: [id number] Arrival date & time: 06/07/18  1624     History   Chief Complaint Chief Complaint  Patient presents with  . URI    HPI Douglas Frederick is a 22 y.o. male.   HPI  Patient is here for cough, congestion, body aches, and headache for the last 3 to 4 days.  No fever chills.  No nausea vomiting diarrhea.  No known exposure to influenza or strep.  No sore throat.  He has been taking over-the-counter cold medications.  These helped somewhat.  He states the biggest problem is that he is having "migraines".   Past Medical History:  Diagnosis Date  . Asthma   . Bipolar 1 disorder (HCC)   . Depression     Patient Active Problem List   Diagnosis Date Noted  . Bipolar affective disorder, mixed, severe (HCC) 11/06/2014  . Suicide attempt (HCC) 11/06/2014  . Suicidal ideation     History reviewed. No pertinent surgical history.     Home Medications    Prior to Admission medications   Medication Sig Start Date End Date Taking? Authorizing Provider  traZODone (DESYREL) 50 MG tablet Take 1 tablet (50 mg total) by mouth at bedtime as needed for sleep. 11/09/14  Yes Earney Navynuoha, Josephine C, NP  benzonatate (TESSALON) 200 MG capsule Take 1 capsule (200 mg total) by mouth 2 (two) times daily as needed for cough. 06/07/18   Eustace MooreNelson, Jachin Coury Sue, MD  ibuprofen (ADVIL,MOTRIN) 800 MG tablet Take 1 tablet (800 mg total) by mouth 3 (three) times daily. 06/07/18   Eustace MooreNelson, Sheera Illingworth Sue, MD    Family History No family history on file.  Social History Social History   Tobacco Use  . Smoking status: Current Every Day Smoker    Types: Cigarettes  Substance Use Topics  . Alcohol use: No  . Drug use: No     Allergies   Patient has no known allergies.   Review of Systems Review of Systems  Constitutional: Negative for chills, fatigue and fever.  HENT: Positive for congestion, postnasal drip and rhinorrhea. Negative for ear pain and sore throat.     Eyes: Negative for photophobia, pain and visual disturbance.  Respiratory: Positive for cough. Negative for shortness of breath.   Cardiovascular: Negative for chest pain and palpitations.  Gastrointestinal: Negative for abdominal pain and vomiting.  Genitourinary: Negative for dysuria and hematuria.  Musculoskeletal: Negative for arthralgias and back pain.  Skin: Negative for color change and rash.  Neurological: Positive for headaches. Negative for seizures and syncope.  All other systems reviewed and are negative.    Physical Exam Triage Vital Signs ED Triage Vitals  Enc Vitals Group     BP 06/07/18 1646 118/83     Pulse Rate 06/07/18 1646 81     Resp 06/07/18 1646 16     Temp 06/07/18 1646 98.8 F (37.1 C)     Temp Source 06/07/18 1646 Oral     SpO2 06/07/18 1646 95 %     Weight --      Height --      Head Circumference --      Peak Flow --      Pain Score 06/07/18 1648 3     Pain Loc --      Pain Edu? --      Excl. in [facility]? --    No data found.  Updated Vital Signs BP 118/83 (BP Location: Left Arm)   Pulse  81   Temp 98.8 F (37.1 C) (Oral)   Resp 16   SpO2 95%   Physical Exam Constitutional:      General: He is not in acute distress.    Appearance: He is well-developed.     Comments: Does not appear ill.  Smiling  HENT:     Head: Normocephalic and atraumatic.     Right Ear: Tympanic membrane and ear canal normal.     Left Ear: Tympanic membrane normal.     Nose: Congestion and rhinorrhea present.     Mouth/Throat:     Mouth: Mucous membranes are moist.  Eyes:     Conjunctiva/sclera: Conjunctivae normal.     Pupils: Pupils are equal, round, and reactive to light.  Neck:     Musculoskeletal: Normal range of motion and neck supple.  Cardiovascular:     Rate and Rhythm: Normal rate and regular rhythm.     Heart sounds: Normal heart sounds.  Pulmonary:     Effort: Pulmonary effort is normal. No respiratory distress.     Breath sounds: Normal breath  sounds.  Abdominal:     General: Abdomen is flat. There is no distension.     Palpations: Abdomen is soft.  Musculoskeletal: Normal range of motion.  Lymphadenopathy:     Cervical: Cervical adenopathy present.  Skin:    General: Skin is warm and dry.  Neurological:     General: No focal deficit present.     Mental Status: He is alert. Mental status is at baseline.  Psychiatric:        Mood and Affect: Mood normal.        Thought Content: Thought content normal.      UC Treatments / Results  Labs (all labs ordered are listed, but only abnormal results are displayed) Labs Reviewed - No data to display  EKG None  Radiology No results found.  Procedures Procedures (including critical care time)  Medications Ordered in UC Medications - No data to display  Initial Impression / Assessment and Plan / UC Course  I have reviewed the triage vital signs and the nursing notes.  Pertinent labs & imaging results that were available during my care of the patient were reviewed by me and considered in my medical decision making (see chart for details).    Viral illness.  Patient states "this is a common cold".  Basically, yes.  Discussed symptomatic care.  No need for antibiotics. Final Clinical Impressions(s) / UC Diagnoses   Final diagnoses:  Viral upper respiratory tract infection     Discharge Instructions     Get plenty of rest No work until Friday Drink lots of fluids Take ibuprofen 3 times a day with food.  This is for pain or fever Take Tessalon as needed for coughing Expect improvement over the next couple of days   ED Prescriptions    Medication Sig Dispense Auth. Provider   ibuprofen (ADVIL,MOTRIN) 800 MG tablet Take 1 tablet (800 mg total) by mouth 3 (three) times daily. 21 tablet Eustace MooreNelson, Panhia Karl Sue, MD   benzonatate (TESSALON) 200 MG capsule Take 1 capsule (200 mg total) by mouth 2 (two) times daily as needed for cough. 20 capsule Eustace MooreNelson, Mansi Tokar Sue, MD      Controlled Substance Prescriptions Accident Controlled Substance Registry consulted? Not Applicable   Eustace MooreNelson, Rosser Collington Sue, MD 06/07/18 1756

## 2018-06-07 NOTE — Discharge Instructions (Signed)
Get plenty of rest No work until Friday Drink lots of fluids Take ibuprofen 3 times a day with food.  This is for pain or fever Take Tessalon as needed for coughing Expect improvement over the next couple of days

## 2018-06-07 NOTE — ED Triage Notes (Signed)
PT  Reports headache, cough, congestion, body aches for 3-4 days. No NVD

## 2021-03-20 ENCOUNTER — Other Ambulatory Visit: Payer: Self-pay

## 2021-03-20 ENCOUNTER — Encounter (HOSPITAL_BASED_OUTPATIENT_CLINIC_OR_DEPARTMENT_OTHER): Payer: Self-pay | Admitting: Emergency Medicine

## 2021-03-20 ENCOUNTER — Emergency Department (HOSPITAL_BASED_OUTPATIENT_CLINIC_OR_DEPARTMENT_OTHER)
Admission: EM | Admit: 2021-03-20 | Discharge: 2021-03-20 | Disposition: A | Discharge status: Home / Self Care | Payer: Medicaid Other | Attending: Emergency Medicine | Admitting: Emergency Medicine

## 2021-03-20 DIAGNOSIS — Z711 Person with feared health complaint in whom no diagnosis is made: Secondary | ICD-10-CM

## 2021-03-20 DIAGNOSIS — Z113 Encounter for screening for infections with a predominantly sexual mode of transmission: Secondary | ICD-10-CM | POA: Diagnosis not present

## 2021-03-20 DIAGNOSIS — F17210 Nicotine dependence, cigarettes, uncomplicated: Secondary | ICD-10-CM | POA: Diagnosis not present

## 2021-03-20 DIAGNOSIS — Z202 Contact with and (suspected) exposure to infections with a predominantly sexual mode of transmission: Secondary | ICD-10-CM

## 2021-03-20 DIAGNOSIS — J45909 Unspecified asthma, uncomplicated: Secondary | ICD-10-CM | POA: Insufficient documentation

## 2021-03-20 LAB — URINALYSIS, ROUTINE W REFLEX MICROSCOPIC
Bilirubin Urine: NEGATIVE
Glucose, UA: NEGATIVE mg/dL
Hgb urine dipstick: NEGATIVE
Ketones, ur: NEGATIVE mg/dL
Leukocytes,Ua: NEGATIVE
Nitrite: NEGATIVE
Protein, ur: NEGATIVE mg/dL
Specific Gravity, Urine: 1.02 (ref 1.005–1.030)
pH: 6 (ref 5.0–8.0)

## 2021-03-20 MED ORDER — CEFTRIAXONE SODIUM 500 MG IJ SOLR
500.0000 mg | Freq: Once | INTRAMUSCULAR | Status: AC
  Administered 2021-03-20: 500 mg via INTRAMUSCULAR
  Filled 2021-03-20: qty 500

## 2021-03-20 MED ORDER — DOXYCYCLINE HYCLATE 100 MG PO CAPS: 100.0000 mg | ORAL_CAPSULE | Freq: Two times a day (BID) | ORAL | 0 refills | Status: AC

## 2021-03-20 MED ORDER — METRONIDAZOLE 500 MG PO TABS
2000.0000 mg | ORAL_TABLET | Freq: Once | ORAL | Status: AC
  Administered 2021-03-20: 2000 mg via ORAL
  Filled 2021-03-20: qty 4

## 2021-03-20 MED ORDER — DIPHENHYDRAMINE HCL 25 MG PO CAPS
25.0000 mg | ORAL_CAPSULE | Freq: Once | ORAL | Status: AC
  Administered 2021-03-20: 25 mg via ORAL
  Filled 2021-03-20: qty 1

## 2021-03-20 MED ORDER — DOXYCYCLINE HYCLATE 100 MG PO TABS
100.0000 mg | ORAL_TABLET | Freq: Once | ORAL | Status: AC
  Administered 2021-03-20: 100 mg via ORAL
  Filled 2021-03-20: qty 1

## 2021-03-20 MED ORDER — LIDOCAINE HCL (PF) 1 % IJ SOLN
1.0000 mL | Freq: Once | INTRAMUSCULAR | Status: AC
  Administered 2021-03-20: 1 mL
  Filled 2021-03-20: qty 5

## 2021-03-20 NOTE — ED Notes (Signed)
Cab called for patient.
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
## 2021-03-20 NOTE — Discharge Instructions (Signed)
You were treated for gonorrhea, chlamydia, and trichomonas today. Your results for gonorrhea and chlamydia are still pending.  You should receive a phone call if they are positive.  However if they are positive, you do not need further treatment, just finish the antibiotics as prescribed. You will need to abstain from sex for at least a week after treatment to prevent reinfection. Return to the emergency room if you develop high fevers, persistent vomiting, severe worsening abdominal pain, or any new, worsening, or concerning symptoms
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
## 2021-03-20 NOTE — ED Notes (Signed)
Spoke with lab to add on urine GC/ Chlamydia

## 2021-03-21 LAB — GC/CHLAMYDIA PROBE AMP (~~LOC~~) NOT AT ARMC
Chlamydia: POSITIVE — AB
Comment: NEGATIVE
Comment: NORMAL
Neisseria Gonorrhea: NEGATIVE

## 2022-08-27 ENCOUNTER — Other Ambulatory Visit: Payer: Self-pay

## 2022-08-27 ENCOUNTER — Emergency Department (HOSPITAL_COMMUNITY)
Admission: EM | Admit: 2022-08-27 | Discharge: 2022-08-27 | Disposition: A | Discharge status: Home / Self Care | Attending: Emergency Medicine | Admitting: Emergency Medicine

## 2022-08-27 ENCOUNTER — Encounter (HOSPITAL_COMMUNITY): Payer: Self-pay | Admitting: Emergency Medicine

## 2022-08-27 ENCOUNTER — Emergency Department (HOSPITAL_COMMUNITY)

## 2022-08-27 DIAGNOSIS — S01419A Laceration without foreign body of unspecified cheek and temporomandibular area, initial encounter: Secondary | ICD-10-CM | POA: Diagnosis not present

## 2022-08-27 DIAGNOSIS — S0285XA Fracture of orbit, unspecified, initial encounter for closed fracture: Secondary | ICD-10-CM

## 2022-08-27 DIAGNOSIS — S0993XA Unspecified injury of face, initial encounter: Secondary | ICD-10-CM | POA: Diagnosis present

## 2022-08-27 DIAGNOSIS — S0230XA Fracture of orbital floor, unspecified side, initial encounter for closed fracture: Secondary | ICD-10-CM | POA: Insufficient documentation

## 2022-08-27 DIAGNOSIS — S0181XA Laceration without foreign body of other part of head, initial encounter: Secondary | ICD-10-CM

## 2022-08-27 DIAGNOSIS — J45909 Unspecified asthma, uncomplicated: Secondary | ICD-10-CM | POA: Diagnosis not present

## 2022-08-27 MED ORDER — LIDOCAINE-EPINEPHRINE-TETRACAINE (LET) TOPICAL GEL
3.0000 mL | Freq: Once | TOPICAL | Status: AC
  Administered 2022-08-27: 3 mL via TOPICAL
  Filled 2022-08-27: qty 3

## 2022-08-27 MED ORDER — CEPHALEXIN 500 MG PO CAPS: 500.0000 mg | ORAL_CAPSULE | Freq: Four times a day (QID) | ORAL | 0 refills | Status: AC

## 2022-08-27 MED ORDER — CEPHALEXIN 500 MG PO CAPS
500.0000 mg | ORAL_CAPSULE | Freq: Once | ORAL | Status: AC
  Administered 2022-08-27: 500 mg via ORAL
  Filled 2022-08-27: qty 1

## 2022-08-27 MED ORDER — TETRACAINE HCL 0.5 % OP SOLN
1.0000 [drp] | Freq: Once | OPHTHALMIC | Status: AC
  Administered 2022-08-27: 1 [drp] via OPHTHALMIC
  Filled 2022-08-27: qty 4

## 2022-08-27 MED ORDER — LIDOCAINE HCL (PF) 1 % IJ SOLN
5.0000 mL | Freq: Once | INTRAMUSCULAR | Status: AC
  Administered 2022-08-27: 5 mL
  Filled 2022-08-27: qty 30

## 2022-08-27 MED ORDER — IBUPROFEN 800 MG PO TABS
800.0000 mg | ORAL_TABLET | Freq: Once | ORAL | Status: AC
  Administered 2022-08-27: 800 mg via ORAL
  Filled 2022-08-27: qty 1

## 2022-08-27 NOTE — Discharge Instructions (Addendum)
Return to the ED with new or worsening signs or symptoms such as double vision, trouble seeing, pain behind the right eye You may continue taking ibuprofen every 6 hours as needed for pain. You will need to follow-up with Dr. Johney Maine of ophthalmology.  I have attached his contact information.  Please call and make an appointment to be seen tomorrow morning. You will need to take an antibiotic for the next 5 days.  You will take this 4 times daily.  You have received your first dose here tonight. Please read the attached guides concerning laceration care as an adult as well as orbital fractures. Please return to the ED in 5 days for suture removal
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
